# Patient Record
Sex: Female | Born: 1975 | Race: Black or African American | Hispanic: No | Marital: Single | Smoking: Current every day smoker
Health system: Southern US, Community
[De-identification: ages and names within clinical notes are randomized; demographics above are authoritative.]

## PROBLEM LIST (undated history)

## (undated) ENCOUNTER — Emergency Department (HOSPITAL_COMMUNITY): Admission: EM | Discharge status: Against Medical Advice | Payer: Self-pay | Source: Home / Self Care

## (undated) DIAGNOSIS — J45909 Unspecified asthma, uncomplicated: Secondary | ICD-10-CM

## (undated) HISTORY — PX: HERNIA REPAIR: SHX51

---

## 2000-12-02 ENCOUNTER — Other Ambulatory Visit: Admission: RE | Admit: 2000-12-02 | Discharge: 2000-12-02 | Payer: Self-pay | Admitting: Obstetrics and Gynecology

## 2009-06-02 ENCOUNTER — Emergency Department (HOSPITAL_COMMUNITY): Admission: EM | Admit: 2009-06-02 | Discharge: 2009-06-02 | Payer: Self-pay | Admitting: Emergency Medicine

## 2013-06-03 ENCOUNTER — Encounter (HOSPITAL_COMMUNITY): Payer: Self-pay | Admitting: Cardiology

## 2013-06-03 ENCOUNTER — Emergency Department (HOSPITAL_COMMUNITY)
Admission: EM | Admit: 2013-06-03 | Discharge: 2013-06-03 | Disposition: A | Discharge status: Home / Self Care | Payer: Self-pay | Attending: Emergency Medicine | Admitting: Emergency Medicine

## 2013-06-03 DIAGNOSIS — L0390 Cellulitis, unspecified: Secondary | ICD-10-CM

## 2013-06-03 DIAGNOSIS — L02419 Cutaneous abscess of limb, unspecified: Secondary | ICD-10-CM | POA: Insufficient documentation

## 2013-06-03 DIAGNOSIS — J45909 Unspecified asthma, uncomplicated: Secondary | ICD-10-CM | POA: Insufficient documentation

## 2013-06-03 DIAGNOSIS — F17200 Nicotine dependence, unspecified, uncomplicated: Secondary | ICD-10-CM | POA: Insufficient documentation

## 2013-06-03 HISTORY — DX: Unspecified asthma, uncomplicated: J45.909

## 2013-06-03 MED ORDER — CEPHALEXIN 500 MG PO CAPS: 500.0000 mg | ORAL_CAPSULE | Freq: Four times a day (QID) | ORAL | Status: DC

## 2013-06-03 NOTE — ED Notes (Signed)
Pt reports she noticed a bite to her left lower leg. States that the area has continued to redden and has a line of streaking from the area. Also reports that the area feels swelling. Denies any fever. Skin warm to touch at the site.

## 2013-06-03 NOTE — ED Provider Notes (Signed)
CSN: [id number]     Arrival date & time 06/03/13  1419 History     First MD Initiated Contact with Patient 06/03/13 1619     Chief Complaint  Patient presents with  . Leg Swelling   (Consider location/radiation/quality/duration/timing/severity/associated sxs/prior Treatment) HPI Comments: Patient presents emergency department with chief complaint of left leg pain and swelling. She states that she noticed a red area on her leg a couple of days ago. She states that she thinks it was a mosquito that it hurt. She states that it has progressively worsened, and there is now some streaking associated with the area. He denies any fevers, chills, nausea, or vomiting. She has not tried taking anything to alleviate her symptoms. She states that there was a small blister that formed, but that this burst while showering yesterday. She reports mild clear discharge.  The history is provided by the patient. No language interpreter was used.    Past Medical History  Diagnosis Date  . Asthma    Past Surgical History  Procedure Laterality Date  . Hernia repair     History reviewed. No pertinent family history. History  Substance Use Topics  . Smoking status: Current Every Day Smoker    Types: Cigarettes  . Smokeless tobacco: Not on file  . Alcohol Use: Yes   OB History   Grav Para Term Preterm Abortions TAB SAB Ect Mult Living                 Review of Systems  All other systems reviewed and are negative.    Allergies  Bee venom  Home Medications   Current Outpatient Rx  Name  Route  Sig  Dispense  Refill  . Multiple Vitamins-Minerals (MULTIVITAMIN WITH MINERALS) tablet   Oral   Take 1 tablet by mouth daily.          BP 105/61  Pulse 76  Temp(Src) 98.7 F (37.1 C) (Oral)  Resp 18  SpO2 100% Physical Exam  Nursing note and vitals reviewed. Constitutional: She is oriented to person, place, and time. She appears well-developed and well-nourished.  HENT:  Head:  Normocephalic and atraumatic.  Eyes: Conjunctivae and EOM are normal. Pupils are equal, round, and reactive to light.  Neck: Normal range of motion. Neck supple.  Cardiovascular: Normal rate and regular rhythm.  Exam reveals no gallop and no friction rub.   No murmur heard. Pulmonary/Chest: Effort normal and breath sounds normal. No respiratory distress. She has no wheezes. She has no rales. She exhibits no tenderness.  Abdominal: Soft. Bowel sounds are normal. She exhibits no distension and no mass. There is no tenderness. There is no rebound and no guarding.  Musculoskeletal: Normal range of motion. She exhibits no edema and no tenderness.  Neurological: She is alert and oriented to person, place, and time.  Skin: Skin is warm and dry.     Area of erythema on the left lower extremity, with some streaking, mild pain  Psychiatric: She has a normal mood and affect. Her behavior is normal. Judgment and thought content normal.    ED Course   Procedures (including critical care time)   1. Cellulitis     MDM  Patient with cellulitis of the lower extremity. She has not compromise. Denies any fevers, chills, nausea, vomiting. Vital signs are stable. Marked the skin with skin marker. Will discharge the patient with antibiotics, and will have the patient followup with primary care provider.  Discussed the patient with Dr. Micheline Maze, who  agrees with discharge plan, but recommends 2 day follow-up.  I discussed this with the patient, who will return in 2 days, or sooner if symptoms worsen.  Roxy Horseman, PA-C 06/03/13 1708

## 2013-06-04 NOTE — ED Provider Notes (Signed)
Medical screening examination/treatment/procedure(s) were performed by non-physician practitioner and as supervising physician I was immediately available for consultation/collaboration.   Megan E Docherty, MD 06/04/13 0020 

## 2013-07-02 ENCOUNTER — Encounter (HOSPITAL_COMMUNITY): Payer: Self-pay | Admitting: Emergency Medicine

## 2013-07-02 ENCOUNTER — Emergency Department (HOSPITAL_COMMUNITY)
Admission: EM | Admit: 2013-07-02 | Discharge: 2013-07-02 | Disposition: A | Discharge status: Home / Self Care | Payer: Self-pay | Attending: Emergency Medicine | Admitting: Emergency Medicine

## 2013-07-02 DIAGNOSIS — L309 Dermatitis, unspecified: Secondary | ICD-10-CM

## 2013-07-02 DIAGNOSIS — F17200 Nicotine dependence, unspecified, uncomplicated: Secondary | ICD-10-CM | POA: Insufficient documentation

## 2013-07-02 DIAGNOSIS — J45909 Unspecified asthma, uncomplicated: Secondary | ICD-10-CM | POA: Insufficient documentation

## 2013-07-02 DIAGNOSIS — L259 Unspecified contact dermatitis, unspecified cause: Secondary | ICD-10-CM | POA: Insufficient documentation

## 2013-07-02 MED ORDER — HYDROCORTISONE 1 % EX CREA: TOPICAL_CREAM | Freq: Two times a day (BID) | CUTANEOUS | Status: DC

## 2013-07-02 NOTE — ED Provider Notes (Signed)
CSN: [id number]     Arrival date & time 07/02/13  1434 History   This chart was scribed for Coral Ceo, PA, working with Candyce Churn, MD by Blanchard Kelch, ED Scribe. This patient was seen in room TR08C/TR08C and the patient's care was started at 3:21 PM.    Chief Complaint  Patient presents with  . Rash    Patient is a 37 y.o. female presenting with rash. The history is provided by the patient. No language interpreter was used.  Rash Associated symptoms: no diarrhea, no fatigue, no fever, no headaches, no joint pain, no myalgias, no nausea, no shortness of breath, no sore throat and not vomiting     HPI Comments: Armella Stogner is a 37 y.o. female who presents to the Emergency Department complaining of a rash.   She was seen here in the ED on 8/23 for cellulitis on her left leg and was prescribed Keflex, which resolved.  In the last few days, she has developed similar areas of red itchy patches, which are located on her arms.  She believes that it may have started as an insect bite, but is unsure.  She denies any contacts (family or friends) with similar rash. She denies getting severe reactions to insect bites in the past. She denies any detergent or lotion changes. She has a past medical history of asthma otherwise no skin problems including eczema. She denies rhinorrhea, sore throat, cough, chest pain, SOB, diarrhea, nausea, vomiting, dysuria, fever, chills or rash aside from bites.  She has tried anti-itch creams over the counter with no relief.     Past Medical History  Diagnosis Date  . Asthma    Past Surgical History  Procedure Laterality Date  . Hernia repair     No family history on file. History  Substance Use Topics  . Smoking status: Current Every Day Smoker -- 0.50 packs/day    Types: Cigarettes  . Smokeless tobacco: Not on file  . Alcohol Use: Yes   OB History   Grav Para Term Preterm Abortions TAB SAB Ect Mult Living                 Review of Systems   Constitutional: Negative for fever, chills, activity change, appetite change and fatigue.  HENT: Negative for sore throat, rhinorrhea, neck pain and neck stiffness.   Eyes: Negative for visual disturbance.  Respiratory: Negative for cough and shortness of breath.   Cardiovascular: Negative for chest pain.  Gastrointestinal: Negative for nausea, vomiting and diarrhea.  Genitourinary: Negative for dysuria.  Musculoskeletal: Negative for myalgias, back pain, joint swelling, arthralgias and gait problem.  Skin: Positive for color change and rash. Negative for pallor and wound.  Neurological: Negative for dizziness, syncope, weakness, light-headedness, numbness and headaches.  Psychiatric/Behavioral: Negative for confusion.  All other systems reviewed and are negative.    Allergies  Bee venom  Home Medications   Current Outpatient Rx  Name  Route  Sig  Dispense  Refill  . Multiple Vitamins-Minerals (MULTIVITAMIN WITH MINERALS) tablet   Oral   Take 1 tablet by mouth daily.          Triage Vitals: BP 112/77  Pulse 84  Temp(Src) 97.3 F (36.3 C) (Oral)  Resp 18  Ht 5\' 7"  (1.702 m)  Wt 155 lb (70.308 kg)  BMI 24.27 kg/m2  SpO2 99%  LMP 06/15/2013  Filed Vitals:   07/02/13 1438 07/02/13 1443  BP: 112/77   Pulse: 84   Temp: 97.3  F (36.3 C)   TempSrc: Oral   Resp: 18   Height: 5' 7.5" (1.715 m) 5\' 7"  (1.702 m)  Weight: 155 lb (70.308 kg) 155 lb (70.308 kg)  SpO2: 99%     Physical Exam  Nursing note and vitals reviewed. Constitutional: She is oriented to person, place, and time. She appears well-developed and well-nourished.  HENT:  Head: Normocephalic and atraumatic.  Eyes: EOM are normal. Pupils are equal, round, and reactive to light.  Cardiovascular: Normal rate, regular rhythm and normal heart sounds.   Pulmonary/Chest: Effort normal and breath sounds normal. No respiratory distress.  Neurological: She is alert and oriented to person, place, and time.  Skin:  Skin is warm and dry.     3 cm x 2 cm patch of superficial raised erythema with irregular borders to left lateral forearm with pinpoint lesion in center which is closed. No drainage of wound.     ED Course  Procedures (including critical care time)  DIAGNOSTIC STUDIES: Oxygen Saturation is 99% on room air, normal by my interpretation.    COORDINATION OF CARE:  Labs Review Labs Reviewed - No data to display Imaging Review No results found.  MDM  No diagnosis found.  Akela Pocius is a 37 y.o. female who presents to the Emergency Department complaining of a rash.    Etiology of rash is possibly due to allergic vs. contact dermatitis.  Rash does not appear to be a cellulitis at this time.  Patient afebrile and in no acute distress.  Patient was prescribed hydrocortisone cream for outpatient management. Patient instructed to also take Benadryl for symptomatic relief.  Patient was instructed to return to the ED if they experience any spreading erythema, fever, severe pain, or other concerns.  Patient was in agreement with discharge and plan.     Final impressions: 1. Dermatitis     Luiz Iron PA-C   This patient was discussed with Dr. Wenda Low, PA-C 07/04/13 [phone]

## 2013-07-02 NOTE — ED Notes (Signed)
Patient states she was here for cellulitis on 06/02/2013, she claims since that time, she has had several places pop up that are similar.  Patient wanted to be checked today.

## 2013-07-05 NOTE — ED Provider Notes (Signed)
Medical screening examination/treatment/procedure(s) were performed by non-physician practitioner and as supervising physician I was immediately available for consultation/collaboration.  Denorris Reust, MD 07/05/13 0614 

## 2014-02-05 ENCOUNTER — Emergency Department (HOSPITAL_COMMUNITY)
Admission: EM | Admit: 2014-02-05 | Discharge: 2014-02-05 | Disposition: A | Discharge status: Home / Self Care | Payer: Self-pay | Attending: Emergency Medicine | Admitting: Emergency Medicine

## 2014-02-05 ENCOUNTER — Encounter (HOSPITAL_COMMUNITY): Payer: Self-pay | Admitting: Emergency Medicine

## 2014-02-05 DIAGNOSIS — L139 Bullous disorder, unspecified: Secondary | ICD-10-CM | POA: Insufficient documentation

## 2014-02-05 DIAGNOSIS — L988 Other specified disorders of the skin and subcutaneous tissue: Secondary | ICD-10-CM | POA: Insufficient documentation

## 2014-02-05 DIAGNOSIS — R238 Other skin changes: Secondary | ICD-10-CM

## 2014-02-05 DIAGNOSIS — J45909 Unspecified asthma, uncomplicated: Secondary | ICD-10-CM | POA: Insufficient documentation

## 2014-02-05 DIAGNOSIS — F17200 Nicotine dependence, unspecified, uncomplicated: Secondary | ICD-10-CM | POA: Insufficient documentation

## 2014-02-05 MED ORDER — PREDNISONE 20 MG PO TABS: ORAL_TABLET | ORAL | Status: DC

## 2014-02-05 MED ORDER — DIPHENHYDRAMINE HCL 25 MG PO TABS: 25.0000 mg | ORAL_TABLET | Freq: Four times a day (QID) | ORAL | Status: DC

## 2014-02-05 NOTE — Discharge Instructions (Signed)
Blisters Blisters are fluid-filled sacs that form within the skin. Common causes of blistering are friction, burns, and exposure to irritating chemicals. The fluid in the blister protects the underlying damaged skin. Most of the time it is not recommended that you open blisters. When a blister is opened, there is an increased chance for infection. Usually, a blister will open on its own. They then dry up and peel off within 10 days. If the blister is tense and uncomfortable (painful) the fluid may be drained. If it is drained the roof of the blister should be left intact. The draining should only be done by a medical professional under aseptic conditions. Poorly fitting shoes and boots can cause blisters by being too tight or too loose. Wearing extra socks or using tape, bandages, or pads over the blister-prone area helps prevent the problem by reducing friction. Blisters heal more slowly if you have diabetes or if you have problems with your circulation. You need to be careful about medical follow-up to prevent infection. HOME CARE INSTRUCTIONS  Protect areas where blisters have formed until the skin is healed. Use a special bandage with a hole cut in the middle around the blister. This reduces pressure and friction. When the blister breaks, trim off the loose skin and keep the area clean by washing it with soap daily. Soaking the blister or broken-open blister with diluted vinegar twice daily for 15 minutes will dry it up and speed the healing. Use 3 tablespoons of white vinegar per quart of water (45 mL white vinegar per liter of water). An antibiotic ointment and a bandage can be used to cover the area after soaking.  SEEK MEDICAL CARE IF:   You develop increased redness, pain, swelling, or drainage in the blistered area.  You develop a pus-like discharge from the blistered area, chills, or a fever. MAKE SURE YOU:   Understand these instructions.  Will watch your condition.  Will get help right  away if you are not doing well or get worse. Document Released: 11/04/2004 Document Revised: 12/20/2011 Document Reviewed: 10/02/2008 [facility] Patient Information 2014 [place], [place].   Emergency Department Resource Guide 1) Find a Doctor and Pay Out of Pocket Although you won't have to find out who is covered by your insurance plan, it is a good idea to ask around and get recommendations. You will then need to call the office and see if the doctor you have chosen will accept you as a new patient and what types of options they offer for patients who are self-pay. Some doctors offer discounts or will set up payment plans for their patients who do not have insurance, but you will need to ask so you aren't surprised when you get to your appointment.  2) Contact Your Local Health Department Not all health departments have doctors that can see patients for sick visits, but many do, so it is worth a call to see if yours does. If you don't know where your local health department is, you can check in your phone book. The CDC also has a tool to help you locate your state's health department, and many state websites also have listings of all of their local health departments.  3) Find a Walk-in Clinic If your illness is not likely to be very severe or complicated, you may want to try a walk in clinic. These are popping up all over the country in pharmacies, drugstores, and shopping centers. They're usually staffed by nurse practitioners or physician assistants that  have been trained to treat common illnesses and complaints. They're usually fairly quick and inexpensive. However, if you have serious medical issues or chronic medical problems, these are probably not your best option.  No Primary Care Doctor: - Call Health Connect at  [phone] - they can help you locate a primary care doctor that  accepts your insurance, provides certain services, etc. - Physician Referral Service- [phone]  Chronic Pain  Problems: Organization         Address  Phone   Notes  [facility]  [phone] Patients need to be referred by their primary care doctor.   Medication Assistance: Organization         Address  Phone   Notes  [facility] Medication [facility] [address] [place], [place] [address] [phone]) [phone] --Must be a resident of [facility] [place] -- Must have NO insurance coverage whatsoever (no Medicaid/ Medicare, etc.) -- The pt. MUST have a primary care doctor that directs their care regularly and follows them in the community   MedAssist  [phone]) [phone]   [employer]  [phone]    Agencies that provide inexpensive medical care: Organization         Address  Phone   Notes  [facility]  [phone]) [phone]   [facility]    [phone]) [phone]   [facility] [address] [place], [place] [address] [phone]) [phone]   [facility] [address], [place] [phone]   Planned Parenthood    [phone]   [facility]    [phone]   [facility] and [facility]  [address], [place] Phone:  [phone], Fax:  [phone]) [phone] Hours of Operation:  9 am - 6 pm, M-F.  Also accepts Medicaid/Medicare and self-pay.  [facility]  [address], [place] Phone: [phone], Fax: [phone]) [phone]. Hours of Operation:  8:30 am - 5:30 pm, M-F.  Also accepts Medicaid and self-pay.  [facility] [place] [address], [place] Phone: [phone](336) [phone]   [facility] [address], [place], [place] [phone], Ext. [phone] Mondays & Thursdays: 7-9 AM.  First 15 patients are seen on a first come, first serve basis.    Medicaid-accepting [facility] Providers:  Organization         Address  Phone   Notes  [facility] [address], [place] [phone]) [phone] Also  accepts self-pay patients.  [facility] [address], [place]  [phone]) [phone]   [facility] [address], [place] [phone]) [phone]   [facility] [address] [place] [address], [place] [phone]) [phone]   Renaye RakersVeita Bland [address], [place]   [phone] Only accepts [place] Access [place] patients after they have their name applied to their card.   Self-Pay (no insurance) in [facility]:  Organization         Address  Phone   Notes  Sickle Cell Patients, [facility] [address], [place] [phone]) [phone]   [facility] [address], [place] [phone]) [phone]   [facility] [place]  [address], [place] [phone]) [phone]   [facility]/Dr. Osei-Bonsu  [address] [place] [address], [place]  or [place], [address], [place] [phone] Phone number for both [facility] and [place] locations is the same.  [facility] [address], [place] [phone]   [facility] [address] [place] or [address] [phone]   [facility] [address] [place] [phone], phone; [phone], fax Sees patients 1st and 3rd Saturday of every month.  Must not qualify for public or private insurance (i.e. Medicaid, Medicare, [place] Health Choice, Veterans' Benefits)  Household income should be no more than 200% of the poverty level The clinic cannot treat you if you are pregnant or think you are pregnant  Sexually transmitted diseases are not treated at the clinic.    Dental Care: Organization         Address  Phone  Notes  [facility] [place] [phone] Accepts children up to age 73 who are enrolled in [place] or [place]; pregnant  women with a Medicaid card; and children who have applied for Medicaid or [place] Health Choice, but were declined, whose parents can pay a reduced fee at time of service.  [facility] of [facility]  [address], [place] [phone] Accepts children up to age 18 who are enrolled in [place] or [place]; pregnant women with a Medicaid card; and children who have applied for Medicaid or [place] Health Choice, but were declined, whose parents can pay a reduced fee at time of service.  [place] Adult [facility] Access PROGRAM  [place] [phone] Patients are seen by appointment only. Walk-ins are not accepted. [place] will see patients 53 years of age and older. Monday - Tuesday (8am-5pm) Most Wednesdays (8:30-5pm) $30 per visit, cash only  [facility] Adult Dental Access PROGRAM  [address], [facility] [phone] Patients are seen by appointment only. Walk-ins are not accepted. [place] will see patients 78 years of age and older. One Wednesday Evening (Monthly: Volunteer Based).  $30 per visit, cash only  [place]  [phone] for adults; Children under age 30, call Graduate Pediatric Dentistry at [phone]. Children aged 3-14, please call [phone] to request a pediatric application.  Dental services are provided in all areas of dental care including fillings, crowns and bridges, complete and partial dentures, implants, gum treatment, root canals, and extractions. Preventive care is also provided. Treatment is provided to both adults and children. Patients are selected via a lottery and there is often a waiting list.   [facility] [place] [address] [place]  [phone] [email or web address]   [facility] [address] [place], [place] [phone], Ext. [phone] Second and Fourth Thursday of each month, opens at 6:30 AM; Clinic ends at 9 AM.  Patients are  seen on a first-come first-served basis, and a limited number are seen during each clinic.   [facility]  [address] Hillard Danker [place], [place] [phone]   Eligibility Requirements You must have lived in [place], [place], or [place] counties for at least the last three months.   You cannot be eligible for state or federal sponsored [employer], including [employer], [place], or [employer].   You generally cannot be eligible for healthcare insurance through your employer.    How to apply: Eligibility  screenings are held every Tuesday and Wednesday afternoon from 1:00 pm until 4:00 pm. You do not need an appointment for the interview!  [facility] [address] [place], [place]   [place]  [place] Department  [place]  [phone]    Behavioral Health Resources in the Community: Intensive Outpatient Programs Organization         Address  Phone  Notes  [place]. [address], [place], [place] [phone]   [facility] Outpatient [address] [place], [place]   ADS: Alcohol & Drug Svcs [address] [place], [place]   [place] [address],  [place], [place] or [phone]   Substance Abuse Resources Organization         Address  Phone  Notes  Alcohol and Drug Services  [phone]   [place]  [phone]   The [place]   Chinita Pester  [phone]   Residential & Outpatient Substance Abuse Program  [phone]   Psychological Services Organization         Address  Phone  Notes  [facility] [place]  [place]  [phone]   [place] [address] [place] or [phone]    Mobile Crisis  Teams Organization         Address  Phone  Notes  [facility], Mobile Crisis Care Unit  [phone]   [facility]  [address] [place], [place]   [place] [address] [place] [phone]    Self-Help/Support Groups Organization         Address  Phone             Notes  [place]. of [place] - variety of support groups  [place] Call for more information  Narcotics Anonymous (NA), Caring [facility] [address], [employer] [place]  2 meetings at this location   Special educational needs teacher         Address  Phone  Notes  ASAP Residential Treatment [place],    [place]  [phone]   [facility]  [address], [place] [phone], [place], [place]   [place], [place] [phone] Admissions: 8am-3pm M-F  Incentives Substance [place] [address]    [place], [place] [phone]   [facility] [address] [place], [place], [place]   [facility] [address],  [place], [place]   [facility] Programs - Intensive Outpatient [address], [place], [place]   [facility] ([place].) [address]  [place], [place] [phone] or [phone]   [facility] Services (RTS) [address] [place], [place] Accepts Medicaid  [facility] [place] [address],  [place] [phone] Substance Abuse/Addiction Treatment   [facility] Organization         Address  Phone  Notes  [facility]  [phone]   Domenic Schwab, PhD [address] [place], [place]   [phone] or [phone]   [place], [place] [phone]   [facility] [place] [address] [place], [place] [phone]  Insurance/Medicaid/sponsorship through [employer] and Families [address], Ste [place], [place] [phone] [place], [place] [phone]    Dr. Adele Schilder  [phone]   [facility] Dept. 1) [address], [place] 2) [place] 3)  [place] [address], [place] [phone]  [phone]   [place] [phone] or [phone] (After Hours)

## 2014-02-05 NOTE — ED Notes (Signed)
Pt. reports itchy blisters at right lower leg and right forearm onset 2 days ago , respirations unlabored/airway intact , denies fever or chills.

## 2014-02-05 NOTE — ED Provider Notes (Signed)
CSN: [id number]     Arrival date & time 02/05/14  2039 History   First MD Initiated Contact with Patient 02/05/14 2104     Chief Complaint  Patient presents with  . Blister     (Consider location/radiation/quality/duration/timing/severity/associated sxs/prior Treatment) HPI  38 year old female with history of asthma presents for evaluation of a blister. Patient noticed an itchy blister at the right lower leg and her right forearm for the past 2 days. The first blister formed on her right anterior lower leg has been increasing in size. She noticed several other blisters in the same leg and thigh and now having new distal forming on her right arm. Described as itchy and throbbing sensation. No complaints of fever, chills, headache, sore throat, blister in mouth or vagina, having chest pain or shortness of breath. There are no mucous membrane involvement. Blisters are less than 1 cm in size with exception of the first blister which is 2 cm in size. Patient has not been exposed to any new medication. No change in detergent or soap, no environmental changes, no new pets. Patient does not take medication on regular basis. She reports having similar blisters last year that lasted for more than a month. She was initially seen in the ED last August for similar blisters and was diagnosed with having dermatitis and was prescribed antibiotic she took but it did not resolve. She was seen again last September because the symptoms did not resolve and was diagnosed with cellulitis. States she was given hydrocortisone cream which she took for several weeks before it resolved. She denies anyone else at home with similar symptoms. Denies any rash on her palms of hands or soles of feet. Hx of chicken pox in the past.   Past Medical History  Diagnosis Date  . Asthma    Past Surgical History  Procedure Laterality Date  . Hernia repair     No family history on file. History  Substance Use Topics  . Smoking status:  Current Every Day Smoker -- 0.50 packs/day    Types: Cigarettes  . Smokeless tobacco: Not on file  . Alcohol Use: Yes   OB History   Grav Para Term Preterm Abortions TAB SAB Ect Mult Living                 Review of Systems  All other systems reviewed and are negative.     Allergies  Bee venom  Home Medications   Prior to Admission medications   Not on File   BP 134/74  Pulse 88  Temp(Src) 98.4 F (36.9 C) (Oral)  Resp 14  Ht 5\' 7"  (1.702 m)  Wt 153 lb (69.4 kg)  BMI 23.96 kg/m2  SpO2 100%  LMP 01/04/2014 Physical Exam  Nursing note and vitals reviewed. Constitutional: She is oriented to person, place, and time. She appears well-developed and well-nourished. No distress.  HENT:  Head: Atraumatic.  Nose: Nose normal.  Mouth/Throat: Oropharynx is clear and moist.  Eyes: Conjunctivae are normal.  Neck: Neck supple.  Cardiovascular: Normal rate and regular rhythm.   Pulmonary/Chest: Effort normal and breath sounds normal. She has no wheezes.  Abdominal: Soft. There is no tenderness.  Musculoskeletal: She exhibits no edema.  Neurological: She is alert and oriented to person, place, and time.  Skin: No rash (R leg: 2cm bullae to distal anterior shin, 1cm blister to R thigh, 0.5cm blister to R upper shin with erythematous base.  R forearm: 0.3cm blister to extensor forearm with erythematous  base.) noted.  Psychiatric: She has a normal mood and affect.    ED Course  Procedures (including critical care time)  9:52 PM Pt is here with several fluid-filled blister and a 2cm bullae on her R arm and R leg.  Negative Nikolsky sign. It does not appeared infected. The differential of blister and bullae is wide, i suspect this is likely autoimmune in nature give pt has hx of asthma, and has no recent exposure or medication changes.  She has no systemic involvement and in NAD.  i suspect pt will benefit from evaluation through dermatology with skin biopsy to identify the source.   This could also be contact dermatitis but no specific exposure to indicate as such.  Plan to provide a course of steroid/benadryl and give pt referrals.  Strict return precaution discussed.  Pt agrees with plan.    Labs Review Labs Reviewed - No data to display  Imaging Review No results found.   EKG Interpretation None      MDM   Final diagnoses:  Blisters of multiple sites    BP 134/74  Pulse 88  Temp(Src) 98.4 F (36.9 C) (Oral)  Resp 14  Ht 5\' 7"  (1.702 m)  Wt 153 lb (69.4 kg)  BMI 23.96 kg/m2  SpO2 100%  LMP 01/04/2014     Fayrene HelperBowie Khayri Kargbo, PA-C 02/05/14 2159

## 2014-02-06 NOTE — ED Provider Notes (Signed)
Medical screening examination/treatment/procedure(s) were performed by non-physician practitioner and as supervising physician I was immediately available for consultation/collaboration.   EKG Interpretation None        Glynn OctaveStephen Jacklynn Dehaas, MD 02/06/14 [phone]

## 2017-03-02 ENCOUNTER — Emergency Department (HOSPITAL_COMMUNITY)
Admission: EM | Admit: 2017-03-02 | Discharge: 2017-03-02 | Disposition: A | Discharge status: Home / Self Care | Payer: Self-pay | Attending: Emergency Medicine | Admitting: Emergency Medicine

## 2017-03-02 ENCOUNTER — Encounter (HOSPITAL_COMMUNITY): Payer: Self-pay | Admitting: *Deleted

## 2017-03-02 DIAGNOSIS — M67431 Ganglion, right wrist: Secondary | ICD-10-CM | POA: Insufficient documentation

## 2017-03-02 DIAGNOSIS — F10920 Alcohol use, unspecified with intoxication, uncomplicated: Secondary | ICD-10-CM

## 2017-03-02 DIAGNOSIS — J45909 Unspecified asthma, uncomplicated: Secondary | ICD-10-CM | POA: Insufficient documentation

## 2017-03-02 DIAGNOSIS — F17210 Nicotine dependence, cigarettes, uncomplicated: Secondary | ICD-10-CM | POA: Insufficient documentation

## 2017-03-02 DIAGNOSIS — F10129 Alcohol abuse with intoxication, unspecified: Secondary | ICD-10-CM | POA: Insufficient documentation

## 2017-03-02 NOTE — ED Notes (Signed)
Pt ambulatory to waiting room in police custody. Pt verbalized understanding of discharge instructions.   

## 2017-03-02 NOTE — ED Provider Notes (Signed)
[facility] Provider Note   CSN: [id number] Arrival date & time: 03/02/17  0125     History   Chief Complaint Chief Complaint  Patient presents with  . Medical Clearance    HPI Doris Adams is a 41 y.o. female.  HPI Patient is brought to the emergency department for medical clearance as the patient is in place custody and on the breathalyzer at a level of greater than 0.3.  Per police protocol she has to come to the emergency department for medical clearance prior to placement in jail.  Patient is without complaints at this time.  She does admit to drinking alcohol.  She states that she used no other substances.  She has no other complaints related to his night  She does request that since she is here she requested I evaluate a bump his been on her right wrist over the past 3 months.  No fevers or chills.  No redness.  Appears to be a ganglion cyst   Past Medical History:  Diagnosis Date  . Asthma     There are no active problems to display for this patient.   Past Surgical History:  Procedure Laterality Date  . HERNIA REPAIR      OB History    No data available       Home Medications    Prior to Admission medications   Not on File    Family History History reviewed. No pertinent family history.  Social History Social History  Substance Use Topics  . Smoking status: Current Every Day Smoker    Packs/day: 0.50    Types: Cigarettes  . Smokeless tobacco: Never Used  . Alcohol use Yes     Allergies   Bee venom   Review of Systems Review of Systems  All other systems reviewed and are negative.    Physical Exam Updated Vital Signs BP 115/83 (BP Location: Left Arm)   Pulse 72   Temp 97.8 F (36.6 C) (Oral)   Resp 18   Ht 5' 7.5" (1.715 m)   Wt 52.2 kg (115 lb)   LMP 02/25/2017   SpO2 99%   BMI 17.75 kg/m   Physical Exam  Constitutional: She is oriented to person, place, and time. She appears well-developed and  well-nourished.  HENT:  Head: Normocephalic.  Eyes: EOM are normal.  Neck: Normal range of motion.  Cardiovascular: Normal rate.   Pulmonary/Chest: Effort normal and breath sounds normal.  Abdominal: Soft. There is no tenderness.  Musculoskeletal: Normal range of motion.  Ganglion cyst right wrist  Neurological: She is alert and oriented to person, place, and time.  Psychiatric: She has a normal mood and affect.  Nursing note and vitals reviewed.    ED Treatments / Results  Labs (all labs ordered are listed, but only abnormal results are displayed) Labs Reviewed - No data to display  EKG  EKG Interpretation None       Radiology No results found.  Procedures Procedures (including critical care time)  Medications Ordered in ED Medications - No data to display   Initial Impression / Assessment and Plan / ED Course  I have reviewed the triage vital signs and the nursing notes.     medically clear.  Discharged in police custody.  Orthopedic follow-up contact information given for ganglion cyst right wrist  Final Clinical Impressions(s) / ED Diagnoses   Final diagnoses:  Alcoholic intoxication without complication (HCC)  Ganglion cyst of wrist, right    New Prescriptions  New Prescriptions   No medications on file     Azalia Bilis, MD 03/02/17 [phone]

## 2017-03-02 NOTE — ED Triage Notes (Signed)
Pt brought in by [employer] for medical clearance; PD states pt's breathilizer level on scene was 0.3; pt is also wanting to be evaluated for a bump on her right wrist

## 2017-03-02 NOTE — ED Notes (Signed)
EDP at bedside  

## 2017-06-07 ENCOUNTER — Other Ambulatory Visit (HOSPITAL_COMMUNITY): Payer: Self-pay | Admitting: Internal Medicine

## 2017-06-07 DIAGNOSIS — Z1231 Encounter for screening mammogram for malignant neoplasm of breast: Secondary | ICD-10-CM

## 2017-06-15 ENCOUNTER — Ambulatory Visit (HOSPITAL_COMMUNITY)
Admission: RE | Admit: 2017-06-15 | Discharge: 2017-06-15 | Disposition: A | Discharge status: Home / Self Care | Payer: BLUE CROSS/BLUE SHIELD | Source: Ambulatory Visit | Attending: Internal Medicine | Admitting: Internal Medicine

## 2017-06-15 DIAGNOSIS — Z1231 Encounter for screening mammogram for malignant neoplasm of breast: Secondary | ICD-10-CM | POA: Insufficient documentation

## 2018-11-04 ENCOUNTER — Other Ambulatory Visit: Payer: Self-pay

## 2018-11-04 ENCOUNTER — Encounter (HOSPITAL_COMMUNITY): Payer: Self-pay

## 2018-11-04 ENCOUNTER — Emergency Department (HOSPITAL_COMMUNITY)
Admission: EM | Admit: 2018-11-04 | Discharge: 2018-11-04 | Disposition: A | Discharge status: Home / Self Care | Payer: BLUE CROSS/BLUE SHIELD | Attending: Emergency Medicine | Admitting: Emergency Medicine

## 2018-11-04 DIAGNOSIS — J209 Acute bronchitis, unspecified: Secondary | ICD-10-CM | POA: Insufficient documentation

## 2018-11-04 DIAGNOSIS — F17210 Nicotine dependence, cigarettes, uncomplicated: Secondary | ICD-10-CM | POA: Insufficient documentation

## 2018-11-04 DIAGNOSIS — J4521 Mild intermittent asthma with (acute) exacerbation: Secondary | ICD-10-CM

## 2018-11-04 MED ORDER — GUAIFENESIN-CODEINE 100-10 MG/5ML PO SOLN
10.0000 mL | Freq: Once | ORAL | Status: AC
  Administered 2018-11-04: 10 mL via ORAL
  Filled 2018-11-04: qty 10

## 2018-11-04 MED ORDER — ALBUTEROL SULFATE HFA 108 (90 BASE) MCG/ACT IN AERS: 2.0000 | INHALATION_SPRAY | RESPIRATORY_TRACT | 0 refills | Status: AC | PRN

## 2018-11-04 MED ORDER — PREDNISONE 20 MG PO TABS: 40.0000 mg | ORAL_TABLET | Freq: Every day | ORAL | 0 refills | Status: AC

## 2018-11-04 MED ORDER — AMOXICILLIN 500 MG PO CAPS: 1000.0000 mg | ORAL_CAPSULE | Freq: Two times a day (BID) | ORAL | 0 refills | Status: AC

## 2018-11-04 MED ORDER — BENZONATATE 200 MG PO CAPS: 200.0000 mg | ORAL_CAPSULE | Freq: Three times a day (TID) | ORAL | 0 refills | Status: AC | PRN

## 2018-11-04 MED ORDER — HYDROCODONE-HOMATROPINE 5-1.5 MG/5ML PO SYRP: 5.0000 mL | ORAL_SOLUTION | Freq: Four times a day (QID) | ORAL | 0 refills | Status: AC | PRN

## 2018-11-04 NOTE — ED Triage Notes (Signed)
Pt c/o non productive cough, body aches, chills, fever onset Monday, taking ibuprofen and mucinex for her symptoms.  Pt also c/o "cold symptoms"

## 2018-11-04 NOTE — ED Notes (Signed)
Pt ambulatory to waiting room. Pt verbalized understanding of discharge instructions.   

## 2018-11-04 NOTE — ED Provider Notes (Signed)
[facility] EMERGENCY DEPARTMENT Provider Note   CSN: [id number] Arrival date & time: 11/04/18  [id number]     History   Chief Complaint Chief Complaint  Patient presents with  . Cough    HPI Doris Adams is a 43 y.o. female.  Patient presents to the emergency department for evaluation of cough.  She has had a cough for 1 week.  Cough is mostly nonproductive.  She does have a history of asthma, reports that that normally acts up when she is sick.  She has had some slight wheezing but has been using her albuterol inhaler with effect.     Past Medical History:  Diagnosis Date  . Asthma     There are no active problems to display for this patient.   Past Surgical History:  Procedure Laterality Date  . HERNIA REPAIR       OB History   No obstetric history on file.      Home Medications    Prior to Admission medications   Medication Sig Start Date End Date Taking? Authorizing Provider  albuterol (PROVENTIL HFA;VENTOLIN HFA) 108 (90 Base) MCG/ACT inhaler Inhale 2 puffs into the lungs every 4 (four) hours as needed for wheezing or shortness of breath. 11/04/18   Mikai Meints, Canary Brimhristopher J, MD  amoxicillin (AMOXIL) 500 MG capsule Take 2 capsules (1,000 mg total) by mouth 2 (two) times daily. 11/04/18   Gilda CreasePollina, Nya Monds J, MD  benzonatate (TESSALON) 200 MG capsule Take 1 capsule (200 mg total) by mouth 3 (three) times daily as needed for cough. 11/04/18   Morghan Kester, Canary Brimhristopher J, MD  HYDROcodone-homatropine (HYCODAN) 5-1.5 MG/5ML syrup Take 5 mLs by mouth every 6 (six) hours as needed for cough. 11/04/18   Gilda CreasePollina, Jimel Myler J, MD  predniSONE (DELTASONE) 20 MG tablet Take 2 tablets (40 mg total) by mouth daily with breakfast. 11/04/18   Skylinn Vialpando, Canary Brimhristopher J, MD    Family History No family history on file.  Social History Social History   Tobacco Use  . Smoking status: Current Every Day Smoker    Packs/day: 0.50    Types: Cigarettes  . Smokeless tobacco: Never Used    Substance Use Topics  . Alcohol use: Yes  . Drug use: No     Allergies   Bee venom   Review of Systems Review of Systems  Respiratory: Positive for cough and wheezing.   All other systems reviewed and are negative.    Physical Exam Updated Vital Signs BP 118/85 (BP Location: Right Arm)   Pulse 90   Temp 99.8 F (37.7 C) (Oral)   Resp 15   Ht 5' 7.5" (1.715 m)   Wt 68 kg   LMP 10/11/2018 (Approximate)   SpO2 96%   BMI 23.15 kg/m   Physical Exam Vitals signs and nursing note reviewed.  Constitutional:      General: She is not in acute distress.    Appearance: Normal appearance. She is well-developed.  HENT:     Head: Normocephalic and atraumatic.     Right Ear: Hearing normal.     Left Ear: Hearing normal.     Nose: Nose normal.  Eyes:     Conjunctiva/sclera: Conjunctivae normal.     Pupils: Pupils are equal, round, and reactive to light.  Neck:     Musculoskeletal: Normal range of motion and neck supple.  Cardiovascular:     Rate and Rhythm: Regular rhythm.     Heart sounds: S1 normal and S2 normal. No murmur. No friction  rub. No gallop.   Pulmonary:     Effort: Pulmonary effort is normal. No respiratory distress.     Breath sounds: Normal breath sounds.  Chest:     Chest wall: No tenderness.  Abdominal:     General: Bowel sounds are normal.     Palpations: Abdomen is soft.     Tenderness: There is no abdominal tenderness. There is no guarding or rebound. Negative signs include Murphy's sign and McBurney's sign.     Hernia: No hernia is present.  Musculoskeletal: Normal range of motion.  Skin:    General: Skin is warm and dry.     Findings: No rash.  Neurological:     Mental Status: She is alert and oriented to person, place, and time.     GCS: GCS eye subscore is 4. GCS verbal subscore is 5. GCS motor subscore is 6.     Cranial Nerves: No cranial nerve deficit.     Sensory: No sensory deficit.     Coordination: Coordination normal.  Psychiatric:         Speech: Speech normal.        Behavior: Behavior normal.        Thought Content: Thought content normal.      ED Treatments / Results  Labs (all labs ordered are listed, but only abnormal results are displayed) Labs Reviewed - No data to display  EKG None  Radiology No results found.  Procedures Procedures (including critical care time)  Medications Ordered in ED Medications - No data to display   Initial Impression / Assessment and Plan / ED Course  I have reviewed the triage vital signs and the nursing notes.  Pertinent labs & imaging results that were available during my care of the patient were reviewed by me and considered in my medical decision making (see chart for details).     Patient appears well.  Lung examination is normal, no wheezing and no obvious clinical signs of pneumonia.  Final Clinical Impressions(s) / ED Diagnoses   Final diagnoses:  Acute bronchitis, unspecified organism  Mild intermittent asthma with exacerbation    ED Discharge Orders         Ordered    predniSONE (DELTASONE) 20 MG tablet  Daily with breakfast     11/04/18 0404    amoxicillin (AMOXIL) 500 MG capsule  2 times daily     11/04/18 0404    albuterol (PROVENTIL HFA;VENTOLIN HFA) 108 (90 Base) MCG/ACT inhaler  Every 4 hours PRN     11/04/18 0404    benzonatate (TESSALON) 200 MG capsule  3 times daily PRN     11/04/18 0404    HYDROcodone-homatropine (HYCODAN) 5-1.5 MG/5ML syrup  Every 6 hours PRN     11/04/18 0404           Gilda Crease, MD 11/04/18 0405

## 2020-11-06 ENCOUNTER — Emergency Department (HOSPITAL_COMMUNITY)
Admission: EM | Admit: 2020-11-06 | Discharge: 2020-11-06 | Disposition: A | Discharge status: Home / Self Care | Payer: No Typology Code available for payment source | Attending: Emergency Medicine | Admitting: Emergency Medicine

## 2020-11-06 ENCOUNTER — Encounter (HOSPITAL_COMMUNITY): Payer: Self-pay | Admitting: Emergency Medicine

## 2020-11-06 ENCOUNTER — Emergency Department (HOSPITAL_COMMUNITY): Payer: No Typology Code available for payment source

## 2020-11-06 ENCOUNTER — Other Ambulatory Visit: Payer: Self-pay

## 2020-11-06 DIAGNOSIS — S01112A Laceration without foreign body of left eyelid and periocular area, initial encounter: Secondary | ICD-10-CM | POA: Diagnosis not present

## 2020-11-06 DIAGNOSIS — F17210 Nicotine dependence, cigarettes, uncomplicated: Secondary | ICD-10-CM | POA: Diagnosis not present

## 2020-11-06 DIAGNOSIS — M25561 Pain in right knee: Secondary | ICD-10-CM | POA: Insufficient documentation

## 2020-11-06 DIAGNOSIS — M25562 Pain in left knee: Secondary | ICD-10-CM | POA: Insufficient documentation

## 2020-11-06 DIAGNOSIS — Y92410 Unspecified street and highway as the place of occurrence of the external cause: Secondary | ICD-10-CM | POA: Diagnosis not present

## 2020-11-06 DIAGNOSIS — S0181XA Laceration without foreign body of other part of head, initial encounter: Secondary | ICD-10-CM

## 2020-11-06 DIAGNOSIS — J45909 Unspecified asthma, uncomplicated: Secondary | ICD-10-CM | POA: Diagnosis not present

## 2020-11-06 DIAGNOSIS — S0592XA Unspecified injury of left eye and orbit, initial encounter: Secondary | ICD-10-CM | POA: Diagnosis present

## 2020-11-06 MED ORDER — IBUPROFEN 800 MG PO TABS
800.0000 mg | ORAL_TABLET | Freq: Once | ORAL | Status: AC
  Administered 2020-11-06: 800 mg via ORAL
  Filled 2020-11-06: qty 1
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
## 2021-06-28 ENCOUNTER — Encounter (HOSPITAL_COMMUNITY): Payer: Self-pay | Admitting: Emergency Medicine

## 2021-06-28 ENCOUNTER — Emergency Department (HOSPITAL_COMMUNITY)
Admission: EM | Admit: 2021-06-28 | Discharge: 2021-06-28 | Disposition: A | Discharge status: Home / Self Care | Payer: Self-pay | Attending: Emergency Medicine | Admitting: Emergency Medicine

## 2021-06-28 ENCOUNTER — Other Ambulatory Visit: Payer: Self-pay

## 2021-06-28 DIAGNOSIS — N7689 Other specified inflammation of vagina and vulva: Secondary | ICD-10-CM | POA: Insufficient documentation

## 2021-06-28 DIAGNOSIS — Z5321 Procedure and treatment not carried out due to patient leaving prior to being seen by health care provider: Secondary | ICD-10-CM | POA: Insufficient documentation

## 2021-06-28 NOTE — ED Provider Notes (Signed)
Patient reportedly eloped prior to my evaluation.  Have requested nursing set up pelvic exam given patient's complaints.  This had not yet been done.  I was unable to fully evaluate the patient.   Shon Baton, MD 06/28/21 [phone]

## 2021-06-28 NOTE — ED Triage Notes (Signed)
Pt states she has had vaginal itching x 3 days and has been using Vagisil @ home without relief. Denies any urinary symptoms or discharge.

## 2021-10-28 DIAGNOSIS — Z1152 Encounter for screening for COVID-19: Secondary | ICD-10-CM | POA: Diagnosis not present

## 2021-11-26 DIAGNOSIS — Z1152 Encounter for screening for COVID-19: Secondary | ICD-10-CM | POA: Diagnosis not present

## 2021-12-16 DIAGNOSIS — Z1152 Encounter for screening for COVID-19: Secondary | ICD-10-CM | POA: Diagnosis not present

## 2021-12-23 DIAGNOSIS — Z1152 Encounter for screening for COVID-19: Secondary | ICD-10-CM | POA: Diagnosis not present

## 2022-01-22 DIAGNOSIS — Z1152 Encounter for screening for COVID-19: Secondary | ICD-10-CM | POA: Diagnosis not present

## 2022-07-16 ENCOUNTER — Other Ambulatory Visit: Payer: Self-pay

## 2022-07-16 ENCOUNTER — Emergency Department (HOSPITAL_COMMUNITY)
Admission: EM | Admit: 2022-07-16 | Discharge: 2022-07-16 | Disposition: A | Discharge status: Home / Self Care | Payer: Self-pay | Attending: Emergency Medicine | Admitting: Emergency Medicine

## 2022-07-16 ENCOUNTER — Encounter (HOSPITAL_COMMUNITY): Payer: Self-pay | Admitting: *Deleted

## 2022-07-16 DIAGNOSIS — T7840XA Allergy, unspecified, initial encounter: Secondary | ICD-10-CM

## 2022-07-16 DIAGNOSIS — Z79899 Other long term (current) drug therapy: Secondary | ICD-10-CM | POA: Insufficient documentation

## 2022-07-16 DIAGNOSIS — L500 Allergic urticaria: Secondary | ICD-10-CM | POA: Insufficient documentation

## 2022-07-16 DIAGNOSIS — T63441A Toxic effect of venom of bees, accidental (unintentional), initial encounter: Secondary | ICD-10-CM

## 2022-07-16 LAB — BASIC METABOLIC PANEL
Anion gap: 10 (ref 5–15)
BUN: 9 mg/dL (ref 6–20)
CO2: 26 mmol/L (ref 22–32)
Calcium: 8.6 mg/dL — ABNORMAL LOW (ref 8.9–10.3)
Chloride: 103 mmol/L (ref 98–111)
Creatinine, Ser: 0.66 mg/dL (ref 0.44–1.00)
GFR, Estimated: >60 mL/min (ref >60)
Glucose, Bld: 121 mg/dL — ABNORMAL HIGH (ref 70–99)
Potassium: 3 mmol/L — ABNORMAL LOW (ref 3.5–5.1)
Sodium: 139 mmol/L (ref 135–145)

## 2022-07-16 MED ORDER — FAMOTIDINE IN NACL 20-0.9 MG/50ML-% IV SOLN: 20.0000 mg | Freq: Once | INTRAVENOUS | Status: AC

## 2022-07-16 MED ORDER — METHYLPREDNISOLONE SODIUM SUCC 125 MG IJ SOLR: 125.0000 mg | Freq: Once | INTRAMUSCULAR | Status: AC

## 2022-07-16 MED ORDER — SODIUM CHLORIDE 0.9 % IV BOLUS
500.0000 mL | Freq: Once | INTRAVENOUS | Status: AC
  Administered 2022-07-16: 500 mL via INTRAVENOUS

## 2022-07-16 MED ORDER — FAMOTIDINE IN NACL 20-0.9 MG/50ML-% IV SOLN
INTRAVENOUS | Status: AC
  Administered 2022-07-16: 20 mg via INTRAVENOUS
  Filled 2022-07-16: qty 50

## 2022-07-16 MED ORDER — DIPHENHYDRAMINE HCL 50 MG/ML IJ SOLN
INTRAMUSCULAR | Status: AC
  Filled 2022-07-16: qty 1

## 2022-07-16 MED ORDER — FAMOTIDINE 20 MG PO TABS: 20.0000 mg | ORAL_TABLET | Freq: Two times a day (BID) | ORAL | 0 refills | Status: AC

## 2022-07-16 MED ORDER — EPINEPHRINE 0.3 MG/0.3ML IJ SOAJ: 0.3000 mg | INTRAMUSCULAR | 0 refills | Status: AC | PRN

## 2022-07-16 MED ORDER — METHYLPREDNISOLONE SODIUM SUCC 125 MG IJ SOLR
INTRAMUSCULAR | Status: AC
  Administered 2022-07-16: 125 mg via INTRAVENOUS
  Filled 2022-07-16: qty 2

## 2022-07-16 MED ORDER — PREDNISONE 10 MG PO TABS: 40.0000 mg | ORAL_TABLET | Freq: Every day | ORAL | 0 refills | Status: AC

## 2022-07-16 MED ORDER — SODIUM CHLORIDE 0.9 % IV SOLN: INTRAVENOUS | Status: DC

## 2022-07-16 NOTE — ED Provider Notes (Addendum)
[facility] EMERGENCY DEPARTMENT Provider Note   CSN: [id number] Arrival date & time: 07/16/22  1522     History  Chief Complaint  Patient presents with   Insect Bite    30 min ago    Doris Adams is a 46 y.o. female.  Patient stung by bee feels that she was stung just once about 30 minutes ago.  Patient with known allergy to bee stings.  Does not have EpiPen's at home.  Did take 50 mg of Benadryl p.o. about 15 minutes ago.  Patient feels a tightness in the throat and has some rashes developing.  Patient states her tongue and lips do not feel swollen.  Patient denies any wheezing.  Patient states bee sting to the left leg buttocks area.  Patient denies any chest pain.       Home Medications Prior to Admission medications   Medication Sig Start Date End Date Taking? Authorizing Provider  albuterol (PROVENTIL HFA;VENTOLIN HFA) 108 (90 Base) MCG/ACT inhaler Inhale 2 puffs into the lungs every 4 (four) hours as needed for wheezing or shortness of breath. 11/04/18   Pollina, Gwenyth Allegra, MD  albuterol (PROVENTIL HFA;VENTOLIN HFA) 108 (90 Base) MCG/ACT inhaler Inhale 2 puffs into the lungs every 4 (four) hours as needed for wheezing or shortness of breath. 11/04/18   Pollina, Gwenyth Allegra, MD  amoxicillin (AMOXIL) 500 MG capsule Take 2 capsules (1,000 mg total) by mouth 2 (two) times daily. 11/04/18   Orpah Greek, MD  benzonatate (TESSALON) 200 MG capsule Take 1 capsule (200 mg total) by mouth 3 (three) times daily as needed for cough. 11/04/18   Pollina, Gwenyth Allegra, MD  HYDROcodone-homatropine (HYCODAN) 5-1.5 MG/5ML syrup Take 5 mLs by mouth every 6 (six) hours as needed for cough. 11/04/18   Orpah Greek, MD  predniSONE (DELTASONE) 20 MG tablet Take 2 tablets (40 mg total) by mouth daily with breakfast. 11/04/18   Pollina, Gwenyth Allegra, MD      Allergies    Bee venom    Review of Systems   Review of Systems  Constitutional:  Negative for chills and fever.   HENT:  Positive for trouble swallowing. Negative for ear pain, sore throat and voice change.   Eyes:  Negative for pain and visual disturbance.  Respiratory:  Negative for cough, shortness of breath and wheezing.   Cardiovascular:  Negative for chest pain and palpitations.  Gastrointestinal:  Negative for abdominal pain and vomiting.  Genitourinary:  Negative for dysuria and hematuria.  Musculoskeletal:  Negative for arthralgias and back pain.  Skin:  Positive for rash. Negative for color change.  Neurological:  Negative for seizures and syncope.  All other systems reviewed and are negative.   Physical Exam Updated Vital Signs BP 122/83 (BP Location: Right Arm)   Pulse (!) 133   Temp 98.8 F (37.1 C) (Oral)   Resp 18   Ht 1.727 m (5\' 8" )   Wt 42.2 kg   LMP 06/25/2022   SpO2 90%   BMI 14.14 kg/m  Physical Exam Vitals and nursing note reviewed.  Constitutional:      General: She is not in acute distress.    Appearance: Normal appearance. She is well-developed.  HENT:     Head: Normocephalic and atraumatic.     Mouth/Throat:     Pharynx: Oropharynx is clear.     Comments: Tongue appears slightly enlarged but patient states it is not.  No lip swelling. Eyes:     Extraocular Movements:  Extraocular movements intact.     Conjunctiva/sclera: Conjunctivae normal.     Pupils: Pupils are equal, round, and reactive to light.  Cardiovascular:     Rate and Rhythm: Normal rate and regular rhythm.     Heart sounds: No murmur heard. Pulmonary:     Effort: Pulmonary effort is normal. No respiratory distress.     Breath sounds: Normal breath sounds. No stridor. No wheezing, rhonchi or rales.  Chest:     Chest wall: No tenderness.  Abdominal:     Palpations: Abdomen is soft.     Tenderness: There is no abdominal tenderness.  Musculoskeletal:        General: No swelling.     Cervical back: Normal range of motion and neck supple.  Skin:    General: Skin is warm and dry.      Capillary Refill: Capillary refill takes less than 2 seconds.     Findings: Rash present.     Comments: Macular rash is urticarial noted to neck arms upper back area.  Neurological:     Mental Status: She is alert.  Psychiatric:        Mood and Affect: Mood normal.     ED Results / Procedures / Treatments   Labs (all labs ordered are listed, but only abnormal results are displayed) Labs Reviewed  BASIC METABOLIC PANEL    EKG None  Radiology No results found.  Procedures Procedures    Medications Ordered in ED Medications  0.9 %  sodium chloride infusion (has no administration in time range)  sodium chloride 0.9 % bolus 500 mL (has no administration in time range)  famotidine (PEPCID) IVPB 20 mg premix (20 mg Intravenous New Bag/Given 07/16/22 1536)  diphenhydrAMINE (BENADRYL) 50 MG/ML injection (has no administration in time range)  methylPREDNISolone sodium succinate (SOLU-MEDROL) 125 mg/2 mL injection 125 mg (125 mg Intravenous Given 07/16/22 1536)    ED Course/ Medical Decision Making/ A&P                           Medical Decision Making Amount and/or Complexity of Data Reviewed Labs: ordered.  Risk Prescription drug management.   CRITICAL CARE Performed by: Vanetta Mulders Total critical care time: 45 minutes Critical care time was exclusive of separately billable procedures and treating other patients. Critical care was necessary to treat or prevent imminent or life-threatening deterioration. Critical care was time spent personally by me on the following activities: development of treatment plan with patient and/or surrogate as well as nursing, discussions with consultants, evaluation of patient's response to treatment, examination of patient, obtaining history from patient or surrogate, ordering and performing treatments and interventions, ordering and review of laboratory studies, ordering and review of radiographic studies, pulse oximetry and re-evaluation  of patient's condition.  Patient with allergic reaction to bee sting.  Patient is already taken 50 mg of Benadryl orally we will do IV Solu-Medrol and IV Pepcid.  Patient states that her tongue is not swelling but clinically have some concern that there may be some swelling to it but patient is normally can tell for weekend.  We will observe her carefully for this.  If there seems to be a problem with the tongue will give EpiPen.  Urticaria resolved.  Patient went on to develop a swelling of the face and upper lip.  No tongue swelling.  Through observation.  Patient's face swelling is now improved.  Still has some upper lip swelling.  Tongue  is fine.  Patient's tightness in the throat area has completely resolved.  Patient be discharged home with Benadryl every 6 hours for the next 24 hours and Pepcid for the next 7 days and prednisone for the next 5 days and 2 EpiPen's.  Patient will return for any new or worse symptoms.  Patient nontoxic no acute distress at this time stable for discharge home.  Final Clinical Impression(s) / ED Diagnoses Final diagnoses:  Bee sting, accidental or unintentional, initial encounter  Allergic reaction, initial encounter    Rx / DC Orders ED Discharge Orders     None         Vanetta Mulders, MD 07/16/22 1537    Vanetta Mulders, MD 07/16/22 1538    Vanetta Mulders, MD 07/16/22 1811
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
## 2022-07-16 NOTE — Discharge Instructions (Addendum)
Take the Pepcid as directed for the next 7 days.  Take the prednisone as directed for the next 5 days.  Take the Benadryl that you have at home 1 tablet every 6 hours for the next 24 hours.  Also at the pharmacy pick up the EpiPens.  Return for any new or worse symptoms.

## 2022-07-16 NOTE — ED Triage Notes (Signed)
Pt stung by bee 30 min ago, took two benadryl. Pt states she having a hard time swallowing. Pt with hives to face and all over. Pt was stung to her bottom

## 2022-07-17 NOTE — ED Notes (Signed)
Pt came to ED stating her medications were not at [facility]. Nurse printed off d/c summary again and circled the pharmacy that the meds were sent to.  07/17/22/@1630 
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
## 2023-02-02 DIAGNOSIS — Z681 Body mass index (BMI) 19 or less, adult: Secondary | ICD-10-CM | POA: Diagnosis not present

## 2023-02-02 DIAGNOSIS — H00015 Hordeolum externum left lower eyelid: Secondary | ICD-10-CM | POA: Diagnosis not present

## 2023-02-02 DIAGNOSIS — J019 Acute sinusitis, unspecified: Secondary | ICD-10-CM | POA: Diagnosis not present

## 2024-07-02 ENCOUNTER — Ambulatory Visit: Payer: Self-pay

## 2024-07-02 NOTE — Telephone Encounter (Signed)
 FYI Only or Action Required?: FYI only for provider.  Patient was last seen in primary care on NA.  Called Nurse Triage reporting Hip Pain.  Symptoms began about a month ago.  Interventions attempted: OTC medications: Tylenol.  Symptoms are: stable.  Triage Disposition: Home Care  Patient/caregiver understands and will follow disposition?: Yes    Copied from CRM [id number]. Topic: Clinical - Red Word Triage >> Jul 02, 2024  9:16 AM Willma R wrote: [facility] that prompted transfer to Nurse Triage: Patient states she is recently having pain in her right hip. Started walking more so not sure if it may be from that. Would like to establish at [facility]. Reason for Disposition  Hip pain  Answer Assessment - Initial Assessment Questions Patient says she's wanting to get in as a new patient that she's been slacking on her healthcare. She says for the past month or longer she's noticed right hip pain when she gets up moving around. Sitting, laying in the bed no pain. She says when she gets up from sitting or OOB, her hip is stiff and the movement makes it hurt, but as she moves around the pain goes away. Tylenol 500 mg once the other day helped the pain, doesn't take meds all the time. Advised first available is in February. She asked me to schedule and add her to the waiting list.   1. LOCATION and RADIATION: Where is the pain located? Does the pain spread (shoot) anywhere else?     Right hip 2. QUALITY: What does the pain feel like?  (e.g., sharp, dull, aching, burning)      Tightness 3. SEVERITY: How bad is the pain? What does it keep you from doing?   (Scale 1-10; or mild, moderate, severe)     3-4 4. ONSET: When did the pain start? Does it come and go, or is it there all the time?     1 month ago 5. WORK OR EXERCISE: Has there been any recent work or exercise that involved this part of the body?      Started walking exercise about a month ago 6. CAUSE:  What do you think is causing the hip pain?      Not using the hip 7. AGGRAVATING FACTORS: What makes the hip pain worse? (e.g., walking, climbing stairs, running)     Resting before movement 8. OTHER SYMPTOMS: Do you have any other symptoms? (e.g., back pain, pain shooting down leg,  fever, rash)     No  Protocols used: Hip Pain-A-AH

## 2024-11-12 ENCOUNTER — Ambulatory Visit: Admitting: Physician Assistant

## 2025-01-18 ENCOUNTER — Ambulatory Visit: Admitting: Physician Assistant
